# Patient Record
Sex: Female | Born: 2005
Health system: Southern US, Community
[De-identification: ages and names within clinical notes are randomized; demographics above are authoritative.]

## PROBLEM LIST (undated history)

## (undated) DIAGNOSIS — J189 Pneumonia, unspecified organism: Secondary | ICD-10-CM

## (undated) DIAGNOSIS — T63441A Toxic effect of venom of bees, accidental (unintentional), initial encounter: Secondary | ICD-10-CM

## (undated) HISTORY — DX: Toxic effect of venom of bees, accidental (unintentional), initial encounter: T63.441A

## (undated) HISTORY — DX: Pneumonia, unspecified organism: J18.9

---
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
## 2024-01-02 ENCOUNTER — Telehealth: Payer: Self-pay

## 2024-01-02 NOTE — Telephone Encounter (Signed)
Returned call, left generic voice message and encouraged call back with questions/concerns.

## 2024-01-02 NOTE — Telephone Encounter (Signed)
Brettney and mother called asking for advice on new symptoms vomiting, diarrhea, low grade fever. Mother noted fever started yesterday at 5pm low grade fever of 99. Then followed by vomiting, diarrhea until 5 am today. It has been managed by zofran taken at home.   Triaged by Calla Kicks CPNP, DNP, explained to monitor for 24/48 hours keep treating as they have been. If new or worsening symptoms arise to call our office for an appointment. Mother agreed.

## 2024-05-24 ENCOUNTER — Other Ambulatory Visit: Payer: Self-pay | Admitting: Pediatrics

## 2024-06-06 ENCOUNTER — Ambulatory Visit (INDEPENDENT_AMBULATORY_CARE_PROVIDER_SITE_OTHER): Admitting: Pediatrics

## 2024-06-06 ENCOUNTER — Encounter: Payer: Self-pay | Admitting: Pediatrics

## 2024-06-06 VITALS — BP 118/74 | Ht 65.5 in | Wt 119.9 lb

## 2024-06-06 DIAGNOSIS — Z68.41 Body mass index (BMI) pediatric, 5th percentile to less than 85th percentile for age: Secondary | ICD-10-CM

## 2024-06-06 DIAGNOSIS — Z1339 Encounter for screening examination for other mental health and behavioral disorders: Secondary | ICD-10-CM

## 2024-06-06 DIAGNOSIS — F4322 Adjustment disorder with anxiety: Secondary | ICD-10-CM

## 2024-06-06 DIAGNOSIS — Z23 Encounter for immunization: Secondary | ICD-10-CM | POA: Diagnosis not present

## 2024-06-06 DIAGNOSIS — Z0000 Encounter for general adult medical examination without abnormal findings: Secondary | ICD-10-CM

## 2024-06-06 DIAGNOSIS — D2262 Melanocytic nevi of left upper limb, including shoulder: Secondary | ICD-10-CM | POA: Insufficient documentation

## 2024-06-06 DIAGNOSIS — Z0001 Encounter for general adult medical examination with abnormal findings: Secondary | ICD-10-CM

## 2024-06-06 NOTE — Progress Notes (Signed)
 Subjective:     History was provided by the patient and mother. Nicole Benjamin was given time to discuss concerns with provider without parent in the room.  Confidentiality was discussed with the patient and, if applicable, with caregiver as well.   Nicole Benjamin is a 18 y.o. female who is here for this well-child visit.  Immunization History  Administered Date(s) Administered   DTaP 12/13/2005, 02/07/2006, 04/20/2006, 01/24/2007, 04/13/2011   HIB (PRP-OMP) 12/13/2005, 02/07/2006, 02/11/2009   Hepatitis A 10/21/2006, 04/25/2007   Hepatitis B 2005/05/04, 12/13/2005, 07/21/2006   IPV 12/13/2005, 02/07/2006, 07/21/2006, 04/13/2011   Influenza Nasal 10/23/2008, 09/29/2010, 09/29/2011, 11/06/2012   Influenza,Quad,Nasal, Live 09/13/2013, 08/28/2014   Influenza,inj,Quad PF,6+ Mos 10/01/2016, 09/16/2017, 09/14/2018, 11/13/2020, 09/16/2022   Influenza,inj,quad, With Preservative 12/10/2015   Influenza-Unspecified 12/30/2021   MMR 10/21/2006, 04/13/2011   MenQuadfi_Meningococcal Groups ACYW Conjugate 05/26/2022   Meningococcal B, OMV 06/06/2024   Meningococcal Conjugate 05/20/2017   Pneumococcal Conjugate-13 12/13/2005, 02/07/2006, 04/20/2006, 01/24/2007   Rotavirus Pentavalent 12/13/2005, 02/07/2006, 04/20/2006   Tdap 05/20/2017   Varicella 10/21/2006, 04/13/2011   The following portions of the patient's history were reviewed and updated as appropriate: allergies, current medications, past family history, past medical history, past social history, past surgical history, and problem list.  Current Issues: Current concerns include brown/black nodule on left bicep -has been present for a while -no changes, doesn't bother or hurt. Currently menstruating? yes; current menstrual pattern: regular every month without intermenstrual spotting Sexually active? no  Does patient snore? no   Review of Nutrition: Current diet: meats, vegetables, fruits, calcium in the diet Balanced diet? yes  Social  Screening:  Parental relations: good Sibling relations: brothers: 1 older and sisters: 2 older Discipline concerns? no Concerns regarding behavior with peers? no School performance: doing well; no concerns Secondhand smoke exposure? no  Screening Questions: Risk factors for anemia: no Risk factors for vision problems: no Risk factors for hearing problems: no Risk factors for tuberculosis: no Risk factors for dyslipidemia: no Risk factors for sexually-transmitted infections: no Risk factors for alcohol/drug use:  no    Objective:     Vitals:   06/06/24 1007  BP: 118/74  Weight: 119 lb 14.4 oz (54.4 kg)  Height: 5' 5.5 (1.664 m)   Growth parameters are noted and are appropriate for age.  General:   alert, cooperative, appears stated age, and no distress  Gait:   normal  Skin:   normal, small nodule on left bicep that is brown-black  Oral cavity:   lips, mucosa, and tongue normal; teeth and gums normal  Eyes:   sclerae white, pupils equal and reactive, red reflex normal bilaterally  Ears:   normal bilaterally  Neck:   no adenopathy, no carotid bruit, no JVD, supple, symmetrical, trachea midline, and thyroid not enlarged, symmetric, no tenderness/mass/nodules  Lungs:  clear to auscultation bilaterally  Heart:   regular rate and rhythm, S1, S2 normal, no murmur, click, rub or gallop and normal apical impulse  Abdomen:  soft, non-tender; bowel sounds normal; no masses,  no organomegaly  GU:  exam deferred  Tanner Stage:   B5  Extremities:  extremities normal, atraumatic, no cyanosis or edema  Neuro:  normal without focal findings, mental status, speech normal, alert and oriented x3, PERLA, and reflexes normal and symmetric     Assessment:    Well adolescent.   Nevus Adjustment disorder with anxiety  Plan:    1. Anticipatory guidance discussed. Specific topics reviewed: bicycle helmets, breast self-exam, drugs, ETOH, and tobacco, importance  of regular dental care,  importance of regular exercise, importance of varied diet, limit TV, media violence, minimize junk food, puberty, safe storage of any firearms in the home, seat belts, and sex; STD and pregnancy prevention.  2.  Weight management:  The patient was counseled regarding nutrition and physical activity.  3. Development: appropriate for age  58. Immunizations today: MenB vaccine per orders. Indications, contraindications and side effects of vaccine/vaccines discussed with parent and parent verbally expressed understanding and also agreed with the administration of vaccine/vaccines as ordered above today.Handout (VIS) given for each vaccine at this visit. History of previous adverse reactions to immunizations? no  5. Follow-up visit in 1 year for next well child visit, or sooner as needed.  6. Recommended continue monitoring nevus of left upper arm.  7. No refills needed for citalopram  at this time.

## 2024-06-06 NOTE — Patient Instructions (Signed)
 At [facility] we value your feedback. You may receive a survey about your visit today. Please share your experience as we strive to create trusting relationships with our patients to provide genuine, compassionate, quality care.   Preventive Care 43-19 Years Old, Female Preventive care refers to lifestyle choices and visits with your health care provider that can promote health and wellness. At this stage in your life, you may start seeing a primary care physician instead of a pediatrician for your preventive care. Preventive care visits are also called wellness exams. What can I expect for my preventive care visit? Counseling During your preventive care visit, your health care provider may ask about your: Medical history, including: Past medical problems. Family medical history. Pregnancy history. Current health, including: Menstrual cycle. Method of birth control. Emotional well-being. Home life and relationship well-being. Sexual activity and sexual health. Lifestyle, including: Alcohol, nicotine or tobacco, and drug use. Access to firearms. Diet, exercise, and sleep habits. Sunscreen use. Motor vehicle safety. Physical exam Your health care provider may check your: Height and weight. These may be used to calculate your BMI (body mass index). BMI is a measurement that tells if you are at a healthy weight. Waist circumference. This measures the distance around your waistline. This measurement also tells if you are at a healthy weight and may help predict your risk of certain diseases, such as type 2 diabetes and high blood pressure. Heart rate and blood pressure. Body temperature. Skin for abnormal spots. Breasts. What immunizations do I need? Vaccines are usually given at various ages, according to a schedule. Your health care provider will recommend vaccines for you based on your age, medical history, and lifestyle or other factors, such as travel or where you  work. What tests do I need? Screening Your health care provider may recommend screening tests for certain conditions. This may include: Vision and hearing tests. Lipid and cholesterol levels. Pelvic exam and Pap test. Hepatitis B test. Hepatitis C test. HIV (human immunodeficiency virus) test. STI (sexually transmitted infection) testing, if you are at risk. Tuberculosis skin test if you have symptoms. BRCA-related cancer screening. This may be done if you have a family history of breast, ovarian, tubal, or peritoneal cancers. Talk with your health care provider about your test results, treatment options, and if necessary, the need for more tests. Follow these instructions at home: Eating and drinking Eat a healthy diet that includes fresh fruits and vegetables, whole grains, lean protein, and low-fat dairy products. Drink enough fluid to keep your urine pale yellow. Do not drink alcohol if: Your health care provider tells you not to drink. You are pregnant, may be pregnant, or are planning to become pregnant. You are under the legal drinking age. In the [place] the legal drinking age is 40. If you drink alcohol: Limit how much you have to 0-1 drink a day. Know how much alcohol is in your drink. In the [place] one drink equals one 12 oz bottle of beer (355 mL), one 5 oz glass of wine (148 mL), or one 1 oz glass of hard liquor (44 mL). Lifestyle Brush your teeth every morning and night with fluoride toothpaste. Floss one time each day. Exercise for at least 30 minutes 5 or more days of the week. Do not use any products that contain nicotine or tobacco. These products include cigarettes, chewing tobacco, and vaping devices, such as e-cigarettes. If you need help quitting, ask your health care provider. Do not use drugs. If you are  sexually active, practice safe sex. Use a condom or other form of protection to prevent STIs. If you do not wish to become pregnant, use a form of birth control.  If you plan to become pregnant, see your health care provider for a prepregnancy visit. Find healthy ways to manage stress, such as: Meditation, yoga, or listening to music. Journaling. Talking to a trusted person. Spending time with friends and family. Safety Always wear your seat belt while driving or riding in a vehicle. Do not drive: If you have been drinking alcohol. Do not ride with someone who has been drinking. When you are tired or distracted. While texting. If you have been using any mind-altering substances or drugs. Wear a helmet and other protective equipment during sports activities. If you have firearms in your house, make sure you follow all gun safety procedures. Seek help if you have been bullied, physically abused, or sexually abused. Use the internet responsibly to avoid dangers, such as online bullying and online sex predators. What's next? Go to your health care provider once a year for an annual wellness visit. Ask your health care provider how often you should have your eyes and teeth checked. Stay up to date on all vaccines. This information is not intended to replace advice given to you by your health care provider. Make sure you discuss any questions you have with your health care provider. Document Revised: 05/20/2021 Document Reviewed: 05/20/2021 Elsevier Patient Education  2024 [employer].

## 2024-07-16 ENCOUNTER — Encounter: Payer: Self-pay | Admitting: Pediatrics

## 2024-07-16 ENCOUNTER — Ambulatory Visit (INDEPENDENT_AMBULATORY_CARE_PROVIDER_SITE_OTHER): Payer: Self-pay | Admitting: Pediatrics

## 2024-07-16 DIAGNOSIS — Z23 Encounter for immunization: Secondary | ICD-10-CM | POA: Diagnosis not present

## 2024-07-16 NOTE — Progress Notes (Signed)
MenB vaccine per orders. Indications, contraindications and side effects of vaccine/vaccines discussed with parent and parent verbally expressed understanding and also agreed with the administration of vaccine/vaccines as ordered above today.Handout (VIS) given for each vaccine at this visit.  

## 2024-07-27 ENCOUNTER — Telehealth: Payer: Self-pay | Admitting: Pediatrics

## 2024-07-27 MED ORDER — ONDANSETRON 4 MG PO TBDP: ORAL_TABLET | ORAL | 0 refills | Status: AC

## 2024-07-27 NOTE — Telephone Encounter (Signed)
 Pt's mom stated that pt has been anxious since starting university and it's causing her to feel nauseous. She asked if rx could be called in (pt cannot take pills).  [facility] - [address], [address]

## 2024-07-27 NOTE — Telephone Encounter (Signed)
 Ondansetron  prescription sent to requested pharmacy to help with anxiety-induced nausea.

## 2024-10-29 ENCOUNTER — Other Ambulatory Visit: Payer: Self-pay | Admitting: Pediatrics

## 2024-12-17 ENCOUNTER — Other Ambulatory Visit: Payer: Self-pay | Admitting: Pediatrics

## 2024-12-17 MED ORDER — EPINEPHRINE 0.3 MG/0.3ML IJ SOAJ: 0.3000 mg | INTRAMUSCULAR | 6 refills | Status: AC | PRN

## 2024-12-17 NOTE — Progress Notes (Signed)
 Epipen  refilled
